# Patient Record
Sex: Female | Born: 1995 | Race: White | Hispanic: No | Marital: Single | Smoking: Never smoker
Health system: Southern US, Community
[De-identification: ages and names within clinical notes are randomized; demographics above are authoritative.]

## PROBLEM LIST (undated history)

## (undated) DIAGNOSIS — Z789 Other specified health status: Secondary | ICD-10-CM

## (undated) HISTORY — DX: Other specified health status: Z78.9

## (undated) HISTORY — PX: NO PAST SURGERIES: SHX2092

---
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
## 2019-08-01 DIAGNOSIS — F329 Major depressive disorder, single episode, unspecified: Secondary | ICD-10-CM | POA: Diagnosis not present

## 2019-08-01 DIAGNOSIS — M6588 Other synovitis and tenosynovitis, other site: Secondary | ICD-10-CM | POA: Diagnosis not present

## 2019-08-01 DIAGNOSIS — M25871 Other specified joint disorders, right ankle and foot: Secondary | ICD-10-CM | POA: Diagnosis not present

## 2019-08-20 DIAGNOSIS — F329 Major depressive disorder, single episode, unspecified: Secondary | ICD-10-CM | POA: Diagnosis not present

## 2019-09-19 DIAGNOSIS — M25871 Other specified joint disorders, right ankle and foot: Secondary | ICD-10-CM | POA: Diagnosis not present

## 2019-09-19 DIAGNOSIS — M25571 Pain in right ankle and joints of right foot: Secondary | ICD-10-CM | POA: Diagnosis not present

## 2019-10-14 DIAGNOSIS — G8918 Other acute postprocedural pain: Secondary | ICD-10-CM | POA: Diagnosis not present

## 2019-10-14 DIAGNOSIS — M25871 Other specified joint disorders, right ankle and foot: Secondary | ICD-10-CM | POA: Diagnosis not present

## 2019-10-14 DIAGNOSIS — M659 Synovitis and tenosynovitis, unspecified: Secondary | ICD-10-CM | POA: Diagnosis not present

## 2019-10-29 DIAGNOSIS — M25871 Other specified joint disorders, right ankle and foot: Secondary | ICD-10-CM | POA: Diagnosis not present

## 2019-11-03 DIAGNOSIS — M25571 Pain in right ankle and joints of right foot: Secondary | ICD-10-CM | POA: Diagnosis not present

## 2019-11-14 DIAGNOSIS — M25571 Pain in right ankle and joints of right foot: Secondary | ICD-10-CM | POA: Diagnosis not present

## 2019-11-17 DIAGNOSIS — M25571 Pain in right ankle and joints of right foot: Secondary | ICD-10-CM | POA: Diagnosis not present

## 2019-11-17 DIAGNOSIS — F329 Major depressive disorder, single episode, unspecified: Secondary | ICD-10-CM | POA: Diagnosis not present

## 2019-11-25 DIAGNOSIS — M25571 Pain in right ankle and joints of right foot: Secondary | ICD-10-CM | POA: Diagnosis not present

## 2019-12-12 DIAGNOSIS — F329 Major depressive disorder, single episode, unspecified: Secondary | ICD-10-CM | POA: Diagnosis not present

## 2019-12-26 DIAGNOSIS — Z0000 Encounter for general adult medical examination without abnormal findings: Secondary | ICD-10-CM | POA: Diagnosis not present

## 2019-12-26 DIAGNOSIS — Z1331 Encounter for screening for depression: Secondary | ICD-10-CM | POA: Diagnosis not present

## 2020-01-20 DIAGNOSIS — F329 Major depressive disorder, single episode, unspecified: Secondary | ICD-10-CM | POA: Diagnosis not present

## 2020-02-18 DIAGNOSIS — S60131A Contusion of right middle finger with damage to nail, initial encounter: Secondary | ICD-10-CM | POA: Diagnosis not present

## 2020-02-20 DIAGNOSIS — F329 Major depressive disorder, single episode, unspecified: Secondary | ICD-10-CM | POA: Diagnosis not present

## 2020-03-08 DIAGNOSIS — Z124 Encounter for screening for malignant neoplasm of cervix: Secondary | ICD-10-CM | POA: Diagnosis not present

## 2020-03-08 DIAGNOSIS — N912 Amenorrhea, unspecified: Secondary | ICD-10-CM | POA: Diagnosis not present

## 2020-03-08 DIAGNOSIS — Z113 Encounter for screening for infections with a predominantly sexual mode of transmission: Secondary | ICD-10-CM | POA: Diagnosis not present

## 2020-03-08 DIAGNOSIS — R87810 Cervical high risk human papillomavirus (HPV) DNA test positive: Secondary | ICD-10-CM | POA: Diagnosis not present

## 2020-03-08 DIAGNOSIS — Z01419 Encounter for gynecological examination (general) (routine) without abnormal findings: Secondary | ICD-10-CM | POA: Diagnosis not present

## 2020-03-08 DIAGNOSIS — Z6826 Body mass index (BMI) 26.0-26.9, adult: Secondary | ICD-10-CM | POA: Diagnosis not present

## 2020-03-08 DIAGNOSIS — Z3202 Encounter for pregnancy test, result negative: Secondary | ICD-10-CM | POA: Diagnosis not present

## 2020-04-08 DIAGNOSIS — N870 Mild cervical dysplasia: Secondary | ICD-10-CM | POA: Diagnosis not present

## 2020-04-08 DIAGNOSIS — Z6826 Body mass index (BMI) 26.0-26.9, adult: Secondary | ICD-10-CM | POA: Diagnosis not present

## 2020-04-08 DIAGNOSIS — B977 Papillomavirus as the cause of diseases classified elsewhere: Secondary | ICD-10-CM | POA: Diagnosis not present

## 2020-04-08 DIAGNOSIS — N72 Inflammatory disease of cervix uteri: Secondary | ICD-10-CM | POA: Diagnosis not present

## 2020-04-08 DIAGNOSIS — R87610 Atypical squamous cells of undetermined significance on cytologic smear of cervix (ASC-US): Secondary | ICD-10-CM | POA: Diagnosis not present

## 2020-04-08 DIAGNOSIS — R87810 Cervical high risk human papillomavirus (HPV) DNA test positive: Secondary | ICD-10-CM | POA: Diagnosis not present

## 2020-04-08 DIAGNOSIS — Z3202 Encounter for pregnancy test, result negative: Secondary | ICD-10-CM | POA: Diagnosis not present

## 2020-04-14 DIAGNOSIS — F329 Major depressive disorder, single episode, unspecified: Secondary | ICD-10-CM | POA: Diagnosis not present

## 2020-06-21 DIAGNOSIS — M79671 Pain in right foot: Secondary | ICD-10-CM | POA: Diagnosis not present

## 2020-06-21 DIAGNOSIS — M25571 Pain in right ankle and joints of right foot: Secondary | ICD-10-CM | POA: Diagnosis not present

## 2020-06-21 DIAGNOSIS — M722 Plantar fascial fibromatosis: Secondary | ICD-10-CM | POA: Diagnosis not present

## 2020-07-06 DIAGNOSIS — F329 Major depressive disorder, single episode, unspecified: Secondary | ICD-10-CM | POA: Diagnosis not present

## 2020-07-20 DIAGNOSIS — Z3201 Encounter for pregnancy test, result positive: Secondary | ICD-10-CM | POA: Diagnosis not present

## 2020-07-20 DIAGNOSIS — Z113 Encounter for screening for infections with a predominantly sexual mode of transmission: Secondary | ICD-10-CM | POA: Diagnosis not present

## 2020-07-20 DIAGNOSIS — N925 Other specified irregular menstruation: Secondary | ICD-10-CM | POA: Diagnosis not present

## 2020-07-20 DIAGNOSIS — Z3480 Encounter for supervision of other normal pregnancy, unspecified trimester: Secondary | ICD-10-CM | POA: Diagnosis not present

## 2020-07-24 NOTE — L&D Delivery Note (Signed)
Patient was found to be 9cm with cervix easily reducable with one push to C/C/+1.  Pt pushed for approx 10 minutes with epidural.   FHT were difficult to assess and appeared to be in the 50s-70s so vaccuum was applied at +3 station 3cm anterior to posterior fontanelle at midline NSVD female infant, Apgars 9/9, weight pending.   The patient had a second degree laceration repaired with 2-0 vicryl. Fundus was firm. EBL was expected amount. Placenta was delivered intact. Vagina was clear.  Delayed cord clamping done for 30-60 seconds while warming baby. Baby was vigorous and doing skin to skin with mother.  Philip Aspen

## 2020-08-20 DIAGNOSIS — O09511 Supervision of elderly primigravida, first trimester: Secondary | ICD-10-CM | POA: Diagnosis not present

## 2020-08-20 DIAGNOSIS — Z3480 Encounter for supervision of other normal pregnancy, unspecified trimester: Secondary | ICD-10-CM | POA: Diagnosis not present

## 2020-08-20 DIAGNOSIS — Z369 Encounter for antenatal screening, unspecified: Secondary | ICD-10-CM | POA: Diagnosis not present

## 2020-08-20 LAB — OB RESULTS CONSOLE ANTIBODY SCREEN: Antibody Screen: NEGATIVE

## 2020-08-20 LAB — OB RESULTS CONSOLE ABO/RH: RH Type: POSITIVE

## 2020-08-20 LAB — OB RESULTS CONSOLE HEPATITIS B SURFACE ANTIGEN: Hepatitis B Surface Ag: NEGATIVE

## 2020-08-20 LAB — OB RESULTS CONSOLE GC/CHLAMYDIA
Chlamydia: NEGATIVE
Gonorrhea: NEGATIVE

## 2020-08-20 LAB — OB RESULTS CONSOLE RPR: RPR: NONREACTIVE

## 2020-08-20 LAB — OB RESULTS CONSOLE RUBELLA ANTIBODY, IGM: Rubella: IMMUNE

## 2020-08-20 LAB — OB RESULTS CONSOLE HIV ANTIBODY (ROUTINE TESTING): HIV: NONREACTIVE

## 2020-09-21 DIAGNOSIS — Z369 Encounter for antenatal screening, unspecified: Secondary | ICD-10-CM | POA: Diagnosis not present

## 2020-09-21 DIAGNOSIS — Z3482 Encounter for supervision of other normal pregnancy, second trimester: Secondary | ICD-10-CM | POA: Diagnosis not present

## 2020-10-18 DIAGNOSIS — Z3A19 19 weeks gestation of pregnancy: Secondary | ICD-10-CM | POA: Diagnosis not present

## 2020-10-18 DIAGNOSIS — Z363 Encounter for antenatal screening for malformations: Secondary | ICD-10-CM | POA: Diagnosis not present

## 2020-11-15 DIAGNOSIS — Z369 Encounter for antenatal screening, unspecified: Secondary | ICD-10-CM | POA: Diagnosis not present

## 2020-12-13 DIAGNOSIS — Z369 Encounter for antenatal screening, unspecified: Secondary | ICD-10-CM | POA: Diagnosis not present

## 2020-12-13 DIAGNOSIS — Z3480 Encounter for supervision of other normal pregnancy, unspecified trimester: Secondary | ICD-10-CM | POA: Diagnosis not present

## 2021-01-05 DIAGNOSIS — Z23 Encounter for immunization: Secondary | ICD-10-CM | POA: Diagnosis not present

## 2021-01-21 DIAGNOSIS — R609 Edema, unspecified: Secondary | ICD-10-CM | POA: Diagnosis not present

## 2021-01-21 DIAGNOSIS — Z369 Encounter for antenatal screening, unspecified: Secondary | ICD-10-CM | POA: Diagnosis not present

## 2021-02-04 DIAGNOSIS — Z369 Encounter for antenatal screening, unspecified: Secondary | ICD-10-CM | POA: Diagnosis not present

## 2021-02-07 DIAGNOSIS — M5489 Other dorsalgia: Secondary | ICD-10-CM | POA: Diagnosis not present

## 2021-02-10 DIAGNOSIS — Z789 Other specified health status: Secondary | ICD-10-CM | POA: Insufficient documentation

## 2021-02-16 ENCOUNTER — Other Ambulatory Visit: Payer: Self-pay

## 2021-02-16 ENCOUNTER — Inpatient Hospital Stay (HOSPITAL_COMMUNITY)
Admission: AD | Admit: 2021-02-16 | Discharge: 2021-02-16 | Disposition: A | Discharge status: Home / Self Care | Payer: BC Managed Care – PPO | Attending: Obstetrics and Gynecology | Admitting: Obstetrics and Gynecology

## 2021-02-16 ENCOUNTER — Encounter (HOSPITAL_COMMUNITY): Payer: Self-pay | Admitting: Obstetrics and Gynecology

## 2021-02-16 DIAGNOSIS — O26893 Other specified pregnancy related conditions, third trimester: Secondary | ICD-10-CM | POA: Insufficient documentation

## 2021-02-16 DIAGNOSIS — Z3A36 36 weeks gestation of pregnancy: Secondary | ICD-10-CM

## 2021-02-16 DIAGNOSIS — O479 False labor, unspecified: Secondary | ICD-10-CM

## 2021-02-16 DIAGNOSIS — N898 Other specified noninflammatory disorders of vagina: Secondary | ICD-10-CM | POA: Insufficient documentation

## 2021-02-16 DIAGNOSIS — Z0371 Encounter for suspected problem with amniotic cavity and membrane ruled out: Secondary | ICD-10-CM

## 2021-02-16 LAB — POCT FERN TEST: POCT Fern Test: NEGATIVE

## 2021-02-16 NOTE — MAU Provider Note (Signed)
Event Date/Time   First Provider Initiated Contact with Patient 02/16/21 0451       S: Ms. Amber Mcfarland is a 25 y.o. G2P1001 at [redacted]w[redacted]d  who presents to MAU today complaining of leaking of fluid since 1 am this morning. She denies vaginal bleeding. She endorses contractions. She reports normal fetal movement.    O: BP 125/75   Pulse 65   Temp 97.9 F (36.6 C) (Oral)   Resp 16   Ht 5\' 3"  (1.6 m)   Wt 77.5 kg   SpO2 99%   BMI 30.26 kg/m  GENERAL: Well-developed, well-nourished female in no acute distress.  HEAD: Normocephalic, atraumatic.  CHEST: Normal effort of breathing, regular heart rate ABDOMEN: Soft, nontender, gravid PELVIC: Normal external female genitalia. Vagina is pink and rugated. Cervix with normal contour, no lesions. Normal discharge.  No pooling.   Cervical exam:  Dilation: 1 Effacement (%): 70 Cervical Position: Posterior Station: -3 Presentation: Vertex Exam by:: K Torphy, RN   Fetal Monitoring: Baseline: 140 Variability: moderate Accelerations: 15x15 Decelerations: none Contractions: irregular  Results for orders placed or performed during the hospital encounter of 02/16/21 (from the past 24 hour(s))  POCT fern test     Status: Normal   Collection Time: 02/16/21  4:59 AM  Result Value Ref Range   POCT Fern Test Negative = intact amniotic membranes      A: SIUP at [redacted]w[redacted]d  Membranes intact  P: Discharge home Labor precautions  [redacted]w[redacted]d, NP 02/16/2021 8:05 AM

## 2021-02-16 NOTE — MAU Note (Signed)
Pt presents to MAU c/o LOF (clear) since 0100 this morning and CTX every 5 min.  Pt endorses +FM, denies vaginal bleeding.  Pt states that she received regular PNC at [facility], last appt was 02/11/2021.  Pt denies any significant medical history, states that this pregnancy has been uncomplicated.

## 2021-02-18 DIAGNOSIS — Z113 Encounter for screening for infections with a predominantly sexual mode of transmission: Secondary | ICD-10-CM | POA: Diagnosis not present

## 2021-02-18 DIAGNOSIS — Z369 Encounter for antenatal screening, unspecified: Secondary | ICD-10-CM | POA: Diagnosis not present

## 2021-02-18 DIAGNOSIS — Z3480 Encounter for supervision of other normal pregnancy, unspecified trimester: Secondary | ICD-10-CM | POA: Diagnosis not present

## 2021-02-21 ENCOUNTER — Ambulatory Visit (INDEPENDENT_AMBULATORY_CARE_PROVIDER_SITE_OTHER): Payer: BC Managed Care – PPO

## 2021-02-21 ENCOUNTER — Encounter: Payer: Self-pay | Admitting: Cardiology

## 2021-02-21 ENCOUNTER — Ambulatory Visit: Payer: BC Managed Care – PPO | Admitting: Cardiology

## 2021-02-21 ENCOUNTER — Other Ambulatory Visit: Payer: Self-pay

## 2021-02-21 VITALS — BP 134/62 | HR 107 | Ht 63.0 in | Wt 172.0 lb

## 2021-02-21 DIAGNOSIS — R002 Palpitations: Secondary | ICD-10-CM | POA: Diagnosis not present

## 2021-02-21 NOTE — Progress Notes (Signed)
Cardio-Obstetrics Clinic  New Evaluation  Date:  02/21/2021   ID:  DAJAI WAHLERT, DOB 04-29-1996, MRN [id number]  PCP:  Melida Quitter, MD   [facility] Providers Cardiologist:  Thomasene Ripple, DO  Electrophysiologist:  None       Referring MD: Charlett Nose, MD   Chief Complaint: Palpitations  History of Present Illness:    Amber Mcfarland is a 25 y.o. female [G2P1001] who is being seen today for the evaluation of palpitations at the request of Charlett Nose, MD. medical history includes mood disorder was previously on Lamictal.  He is here today to be evaluated for palpitations.  Patient tells me over the last several weeks she has had intermittent palpitations.  She notes that he simpatico onset of fast heartbeat which sometimes feels as if her heart skipping beats.  She has not had any chest chest pain but notes that she has had some chest tightness along with this.  She denies any lightheadedness or dizziness.  She has had gradual onset of shortness of breath as her pregnancy progresses but nothing worse.   Prior CV Studies Reviewed: The following studies were reviewed today:  Chest x-ray July 18 2015- IMPRESSION: No acute cardiopulmonary abnormality or acute traumatic injury identified. Mild scoliosis. And we also   Past Medical History:  Diagnosis Date   Medical history non-contributory     Past Surgical History:  Procedure Laterality Date   ANKLE DEBRIDEMENT Right       OB History     Gravida  2   Para  1   Term  1   Preterm      AB      Living  1      SAB      IAB      Ectopic      Multiple  0   Live Births  1               Current Medications: Current Meds  Medication Sig   acetaminophen (TYLENOL) 500 MG tablet Take 1,000 mg by mouth every 6 (six) hours as needed for mild pain or headache.   Prenatal Vit-Fe Fumarate-FA (PRENATAL MULTIVITAMIN) TABS tablet Take 1 tablet by mouth daily at 12 noon.      Allergies:   Patient has no known allergies.   Social History   Socioeconomic History   Marital status: Single    Spouse name: Not on file   Number of children: Not on file   Years of education: Not on file   Highest education level: Not on file  Occupational History   Not on file  Tobacco Use   Smoking status: Never   Smokeless tobacco: Never  Substance and Sexual Activity   Alcohol use: No   Drug use: No   Sexual activity: Yes  Other Topics Concern   Not on file  Social History Narrative   Not on file   Social Determinants of Health   Financial Resource Strain: Low Risk    Difficulty of Paying Living Expenses: Not hard at all  Food Insecurity: Unknown   Worried About Programme researcher, broadcasting/film/video in the Last Year: Not on file   [employer] of Food in the Last Year: Never true  Transportation Needs: No Transportation Needs   Lack of Transportation (Medical): No   Lack of Transportation (Non-Medical): No  Physical Activity: Not on file  Stress: Not on file  Social Connections: Unknown   Frequency of  Communication with Friends and Family: More than three times a week   Frequency of Social Gatherings with Friends and Family: More than three times a week   Attends Religious Services: Not on Scientist, clinical (histocompatibility and immunogenetics)file   Active Member of Clubs or Organizations: Not on file   Attends BankerClub or Organization Meetings: Not on file   Marital Status: Not on file      Family History  Problem Relation Age of Onset   Hypertension Father    Diabetes Father        Borderline   Heart disease Paternal Grandfather    Heart attack Paternal Grandfather [phone]      ROS:   Please see the history of present illness.     Review of Systems  Constitution: Negative for decreased appetite, fever and weight gain.  HENT: Negative for congestion, ear discharge, hoarse voice and sore throat.   Eyes: Negative for discharge, redness, vision loss in right eye and visual halos.  Cardiovascular: Report palpitations.  Negative for  chest pain, dyspnea on exertion, leg swelling, orthopnea.  Respiratory: Negative for cough, hemoptysis, shortness of breath and snoring.   Endocrine: Negative for heat intolerance and polyphagia.  Hematologic/Lymphatic: Negative for bleeding problem. Does not bruise/bleed easily.  Skin: Negative for flushing, nail changes, rash and suspicious lesions.  Musculoskeletal: Negative for arthritis, joint pain, muscle cramps, myalgias, neck pain and stiffness.  Gastrointestinal: Negative for abdominal pain, bowel incontinence, diarrhea and excessive appetite.  Genitourinary: Negative for decreased libido, genital sores and incomplete emptying.  Neurological: Negative for brief paralysis, focal weakness, headaches and loss of balance.  Psychiatric/Behavioral: Negative for altered mental status, depression and suicidal ideas.  Allergic/Immunologic: Negative for HIV exposure and persistent infections.     Labs/EKG Reviewed:    EKG:   EKG was ordered today.  The ekg ordered today demonstrates sinus tachycardia, heart rate 107 bpm  Recent Labs: No results found for requested labs within last 8760 hours.   Recent Lipid Panel No results found for: CHOL, TRIG, HDL, CHOLHDL, LDLCALC, LDLDIRECT  Physical Exam:    VS:  BP 134/62 (BP Location: Right Arm, Patient Position: Sitting, Cuff Size: Normal)   Pulse (!) 107   Ht 5\' 3"  (1.6 m)   Wt 172 lb (78 kg)   SpO2 97%   BMI 30.47 kg/m     Wt Readings from Last 3 Encounters:  02/21/21 172 lb (78 kg)  02/16/21 170 lb 12.8 oz (77.5 kg)  12/23/15 167 lb (75.8 kg) (92 %, Z= 1.41)*   * Growth percentiles are based on CDC (Girls, 2-20 Years) data.     GEN:  Well nourished, well developed in no acute distress HEENT: Normal NECK: No JVD; No carotid bruits LYMPHATICS: No lymphadenopathy CARDIAC: RRR, no murmurs, rubs, gallops RESPIRATORY:  Clear to auscultation without rales, wheezing or rhonchi  ABDOMEN: Soft, non-tender,  non-distended MUSCULOSKELETAL:  No edema; No deformity  SKIN: Warm and dry NEUROLOGIC:  Alert and oriented x 3 PSYCHIATRIC:  Normal affect    Risk Assessment/Risk Calculators:                 ASSESSMENT & PLAN:   Palpitations  I would like to rule out a cardiovascular etiology of this palpitation, therefore at this time I would like to placed a zio patch for   7  days.  She does have a systolic murmur which is suspected to be normal physiology.   Her blood pressure is 134/62 in the office today.  We will  continue to monitor this closely.  She tells me her blood pressure has not been elevated.  once these testing have been performed amd reviewed further reccomendations will be made. For now, I do reccomend that the patient goes to the nearest ED if  symptoms recur.  Patient Instructions  Medication Instructions:  Your physician recommends that you continue on your current medications as directed. Please refer to the Current Medication list given to you today.  *If you need a refill on your cardiac medications before your next appointment, please call your pharmacy*   Lab Work: Your physician recommends that you return for lab work in: Today for Basic Metabolic Panel and Magnesium  If you have labs (blood work) drawn today and your tests are completely normal, you will receive your results only by: MyChart Message (if you have MyChart) OR A paper copy in the mail If you have any lab test that is abnormal or we need to change your treatment, we will call you to review the results.   Testing/Procedures: EKG ZIO XT- Long Term Monitor Instructions  Your physician has requested you wear a ZIO patch monitor for 14 days.  This is a single patch monitor. [employer] supplies one patch monitor per enrollment. Additional stickers are not available. Please do not apply patch if you will be having a Nuclear Stress Test,  Echocardiogram, Cardiac CT, MRI, or Chest Xray during the period  you would be wearing the  monitor. The patch cannot be worn during these tests. You cannot remove and re-apply the  ZIO XT patch monitor.  Your ZIO patch monitor will be mailed 3 day USPS to your address on file. It may take 3-5 days  to receive your monitor after you have been enrolled.  Once you have received your monitor, please review the enclosed instructions. Your monitor  has already been registered assigning a specific monitor serial # to you.  Billing and Patient Assistance Program Information  We have supplied [employer] with any of your insurance information on file for billing purposes. [employer] offers a sliding scale Patient Assistance Program for patients that do not have  insurance, or whose insurance does not completely cover the cost of the ZIO monitor.  You must apply for the Patient Assistance Program to qualify for this discounted rate.  To apply, please call [employer] at [phone], select option 4, select option 2, ask to apply for  Patient Assistance Program. Meredeth Ide will ask your household income, and how many people  are in your household. They will quote your out-of-pocket cost based on that information.  [employer] will also be able to set up a 29-month, interest-free payment plan if needed.  Applying the monitor   Shave hair from upper left chest.  Hold abrader disc by orange tab. Rub abrader in 40 strokes over the upper left chest as  indicated in your monitor instructions.  Clean area with 4 enclosed alcohol pads. Let dry.  Apply patch as indicated in monitor instructions. Patch will be placed under collarbone on left  side of chest with arrow pointing upward.  Rub patch adhesive wings for 2 minutes. Remove white label marked "1". Remove the white  label marked "2". Rub patch adhesive wings for 2 additional minutes.  While looking in a mirror, press and release button in center of patch. A small green light will  flash 3-4 times. This will be your only indicator  that the monitor has been turned on.  Do not shower for the first  24 hours. You may shower after the first 24 hours.  Press the button if you feel a symptom. You will hear a small click. Record Date, Time and  Symptom in the Patient Logbook.  When you are ready to remove the patch, follow instructions on the last 2 pages of Patient  Logbook. Stick patch monitor onto the last page of Patient Logbook.  Place Patient Logbook in the blue and white box. Use locking tab on box and tape box closed  securely. The blue and white box has prepaid postage on it. Please place it in the mailbox as  soon as possible. Your physician should have your test results approximately 7 days after the  monitor has been mailed back to [facility].  Call [facility] Customer Care at [phone] if you have questions regarding  your ZIO XT patch monitor. Call them immediately if you see an orange light blinking on your  monitor.  If your monitor falls off in less than 4 days, contact our Monitor department at [phone].  If your monitor becomes loose or falls off after 4 days call [employer] at [phone] for  suggestions on securing your monitor    Follow-Up: At [facility], you and your health needs are our priority.  As part of our continuing mission to provide you with exceptional heart care, we have created designated Provider Care Teams.  These Care Teams include your primary Cardiologist (physician) and Advanced Practice Providers (APPs -  Physician Assistants and Nurse Practitioners) who all work together to provide you with the care you need, when you need it.  We recommend signing up for the patient portal called "MyChart".  Sign up information is provided on this After Visit Summary.  MyChart is used to connect with patients for Virtual Visits (Telemedicine).  Patients are able to view lab/test results, encounter notes, upcoming appointments, etc.  Non-urgent messages can be sent to your provider  as well.   To learn more about what you can do with MyChart, go to [email or web address].    Your next appointment:   8 week(s)  The format for your next appointment:   In Person  Provider:   Dr. Jolyn Lent Vaani Morren @ [facility]   Other Instructions     Dispo:  No follow-ups on file.   Medication Adjustments/Labs and Tests Ordered: Current medicines are reviewed at length with the patient today.  Concerns regarding medicines are outlined above.  Tests Ordered: Orders Placed This Encounter  Procedures   Magnesium   Basic metabolic panel   LONG TERM MONITOR (3-14 DAYS)   EKG 12-Lead    Medication Changes: No orders of the defined types were placed in this encounter.

## 2021-02-21 NOTE — Patient Instructions (Signed)
Medication Instructions:  Your physician recommends that you continue on your current medications as directed. Please refer to the Current Medication list given to you today.  *If you need a refill on your cardiac medications before your next appointment, please call your pharmacy*   Lab Work: Your physician recommends that you return for lab work in: Today for Basic Metabolic Panel and Magnesium  If you have labs (blood work) drawn today and your tests are completely normal, you will receive your results only by: MyChart Message (if you have MyChart) OR A paper copy in the mail If you have any lab test that is abnormal or we need to change your treatment, we will call you to review the results.   Testing/Procedures: EKG ZIO XT- Long Term Monitor Instructions  Your physician has requested you wear a ZIO patch monitor for 14 days.  This is a single patch monitor. [employer] supplies one patch monitor per enrollment. Additional stickers are not available. Please do not apply patch if you will be having a Nuclear Stress Test,  Echocardiogram, Cardiac CT, MRI, or Chest Xray during the period you would be wearing the  monitor. The patch cannot be worn during these tests. You cannot remove and re-apply the  ZIO XT patch monitor.  Your ZIO patch monitor will be mailed 3 day USPS to your address on file. It may take 3-5 days  to receive your monitor after you have been enrolled.  Once you have received your monitor, please review the enclosed instructions. Your monitor  has already been registered assigning a specific monitor serial # to you.  Billing and Patient Assistance Program Information  We have supplied [employer] with any of your insurance information on file for billing purposes. [employer] offers a sliding scale Patient Assistance Program for patients that do not have  insurance, or whose insurance does not completely cover the cost of the ZIO monitor.  You must apply for the Patient  Assistance Program to qualify for this discounted rate.  To apply, please call [employer] at [phone], select option 4, select option 2, ask to apply for  Patient Assistance Program. Meredeth Ide will ask your household income, and how many people  are in your household. They will quote your out-of-pocket cost based on that information.  [employer] will also be able to set up a 41-month, interest-free payment plan if needed.  Applying the monitor   Shave hair from upper left chest.  Hold abrader disc by orange tab. Rub abrader in 40 strokes over the upper left chest as  indicated in your monitor instructions.  Clean area with 4 enclosed alcohol pads. Let dry.  Apply patch as indicated in monitor instructions. Patch will be placed under collarbone on left  side of chest with arrow pointing upward.  Rub patch adhesive wings for 2 minutes. Remove white label marked "1". Remove the white  label marked "2". Rub patch adhesive wings for 2 additional minutes.  While looking in a mirror, press and release button in center of patch. A small green light will  flash 3-4 times. This will be your only indicator that the monitor has been turned on.  Do not shower for the first 24 hours. You may shower after the first 24 hours.  Press the button if you feel a symptom. You will hear a small click. Record Date, Time and  Symptom in the Patient Logbook.  When you are ready to remove the patch, follow instructions on the last 2 pages of  Patient  Logbook. Stick patch monitor onto the last page of Patient Logbook.  Place Patient Logbook in the blue and white box. Use locking tab on box and tape box closed  securely. The blue and white box has prepaid postage on it. Please place it in the mailbox as  soon as possible. Your physician should have your test results approximately 7 days after the  monitor has been mailed back to [facility].  Call [facility] Customer Care at [phone] if you have questions  regarding  your ZIO XT patch monitor. Call them immediately if you see an orange light blinking on your  monitor.  If your monitor falls off in less than 4 days, contact our Monitor department at [phone].  If your monitor becomes loose or falls off after 4 days call [employer] at [phone] for  suggestions on securing your monitor    Follow-Up: At [facility], you and your health needs are our priority.  As part of our continuing mission to provide you with exceptional heart care, we have created designated Provider Care Teams.  These Care Teams include your primary Cardiologist (physician) and Advanced Practice Providers (APPs -  Physician Assistants and Nurse Practitioners) who all work together to provide you with the care you need, when you need it.  We recommend signing up for the patient portal called "MyChart".  Sign up information is provided on this After Visit Summary.  MyChart is used to connect with patients for Virtual Visits (Telemedicine).  Patients are able to view lab/test results, encounter notes, upcoming appointments, etc.  Non-urgent messages can be sent to your provider as well.   To learn more about what you can do with MyChart, go to [email or web address].    Your next appointment:   8 week(s)  The format for your next appointment:   In Person  Provider:   Dr. Jolyn Lent Tobb @ [facility]   Other Instructions

## 2021-02-22 LAB — BASIC METABOLIC PANEL
BUN/Creatinine Ratio: 16 (ref 9–23)
BUN: 8 mg/dL (ref 6–20)
CO2: 19 mmol/L — ABNORMAL LOW (ref 20–29)
Calcium: 8.6 mg/dL — ABNORMAL LOW (ref 8.7–10.2)
Chloride: 108 mmol/L — ABNORMAL HIGH (ref 96–106)
Creatinine, Ser: 0.51 mg/dL — ABNORMAL LOW (ref 0.57–1.00)
Glucose: 100 mg/dL — ABNORMAL HIGH (ref 65–99)
Potassium: 3.8 mmol/L (ref 3.5–5.2)
Sodium: 140 mmol/L (ref 134–144)
eGFR: 134 mL/min/{1.73_m2} (ref >59)

## 2021-02-22 LAB — MAGNESIUM: Magnesium: 1.7 mg/dL (ref 1.6–2.3)

## 2021-02-25 DIAGNOSIS — Z369 Encounter for antenatal screening, unspecified: Secondary | ICD-10-CM | POA: Diagnosis not present

## 2021-03-02 DIAGNOSIS — R002 Palpitations: Secondary | ICD-10-CM | POA: Diagnosis not present

## 2021-03-04 DIAGNOSIS — Z369 Encounter for antenatal screening, unspecified: Secondary | ICD-10-CM | POA: Diagnosis not present

## 2021-03-08 ENCOUNTER — Encounter (HOSPITAL_COMMUNITY): Payer: Self-pay | Admitting: *Deleted

## 2021-03-08 ENCOUNTER — Telehealth (HOSPITAL_COMMUNITY): Payer: Self-pay | Admitting: *Deleted

## 2021-03-08 NOTE — Telephone Encounter (Signed)
Preadmission screen  

## 2021-03-11 DIAGNOSIS — Z369 Encounter for antenatal screening, unspecified: Secondary | ICD-10-CM | POA: Diagnosis not present

## 2021-03-14 ENCOUNTER — Encounter (HOSPITAL_COMMUNITY): Payer: Self-pay | Admitting: Obstetrics and Gynecology

## 2021-03-14 ENCOUNTER — Other Ambulatory Visit: Payer: Self-pay | Admitting: Obstetrics and Gynecology

## 2021-03-14 ENCOUNTER — Inpatient Hospital Stay (HOSPITAL_COMMUNITY): Payer: BC Managed Care – PPO | Admitting: Anesthesiology

## 2021-03-14 ENCOUNTER — Other Ambulatory Visit: Payer: Self-pay

## 2021-03-14 ENCOUNTER — Inpatient Hospital Stay (HOSPITAL_COMMUNITY)
Admission: AD | Admit: 2021-03-14 | Discharge: 2021-03-15 | DRG: 807 | Disposition: A | Discharge status: Home / Self Care | Payer: BC Managed Care – PPO | Attending: Obstetrics and Gynecology | Admitting: Obstetrics and Gynecology

## 2021-03-14 DIAGNOSIS — Z20822 Contact with and (suspected) exposure to covid-19: Secondary | ICD-10-CM | POA: Diagnosis present

## 2021-03-14 DIAGNOSIS — O99892 Other specified diseases and conditions complicating childbirth: Secondary | ICD-10-CM | POA: Diagnosis not present

## 2021-03-14 DIAGNOSIS — O99824 Streptococcus B carrier state complicating childbirth: Secondary | ICD-10-CM | POA: Diagnosis present

## 2021-03-14 DIAGNOSIS — R000 Tachycardia, unspecified: Secondary | ICD-10-CM | POA: Diagnosis present

## 2021-03-14 DIAGNOSIS — Z3A40 40 weeks gestation of pregnancy: Secondary | ICD-10-CM

## 2021-03-14 DIAGNOSIS — O26893 Other specified pregnancy related conditions, third trimester: Secondary | ICD-10-CM | POA: Diagnosis not present

## 2021-03-14 LAB — RESP PANEL BY RT-PCR (FLU A&B, COVID) ARPGX2
Influenza A by PCR: NEGATIVE
Influenza B by PCR: NEGATIVE
SARS Coronavirus 2 by RT PCR: NEGATIVE

## 2021-03-14 LAB — CBC
HCT: 37.3 % (ref 36.0–46.0)
Hemoglobin: 11.8 g/dL — ABNORMAL LOW (ref 12.0–15.0)
MCH: 26.8 pg (ref 26.0–34.0)
MCHC: 31.6 g/dL (ref 30.0–36.0)
MCV: 84.8 fL (ref 80.0–100.0)
Platelets: 205 10*3/uL (ref 150–400)
RBC: 4.4 MIL/uL (ref 3.87–5.11)
RDW: 13 % (ref 11.5–15.5)
WBC: 7.6 10*3/uL (ref 4.0–10.5)
nRBC: 0 % (ref 0.0–0.2)

## 2021-03-14 LAB — TYPE AND SCREEN
ABO/RH(D): A POS
Antibody Screen: NEGATIVE

## 2021-03-14 LAB — SARS CORONAVIRUS 2 (TAT 6-24 HRS): SARS Coronavirus 2: NEGATIVE

## 2021-03-14 MED ORDER — SODIUM CHLORIDE 0.9 % IV SOLN
2.0000 g | Freq: Once | INTRAVENOUS | Status: AC
  Administered 2021-03-14: 2 g via INTRAVENOUS
  Filled 2021-03-14: qty 2000

## 2021-03-14 MED ORDER — ONDANSETRON HCL 4 MG/2ML IJ SOLN: 4.0000 mg | INTRAMUSCULAR | Status: DC | PRN

## 2021-03-14 MED ORDER — OXYCODONE-ACETAMINOPHEN 5-325 MG PO TABS: 2.0000 | ORAL_TABLET | ORAL | Status: DC | PRN

## 2021-03-14 MED ORDER — EPHEDRINE 5 MG/ML INJ
10.0000 mg | INTRAVENOUS | Status: DC | PRN
Start: 2021-03-14 — End: 2021-03-14

## 2021-03-14 MED ORDER — LACTATED RINGERS IV SOLN: 500.0000 mL | INTRAVENOUS | Status: DC | PRN

## 2021-03-14 MED ORDER — DIPHENHYDRAMINE HCL 25 MG PO CAPS: 25.0000 mg | ORAL_CAPSULE | Freq: Four times a day (QID) | ORAL | Status: DC | PRN

## 2021-03-14 MED ORDER — LACTATED RINGERS IV SOLN: INTRAVENOUS | Status: DC

## 2021-03-14 MED ORDER — MISOPROSTOL 25 MCG QUARTER TABLET: 25.0000 ug | ORAL_TABLET | ORAL | Status: DC | PRN

## 2021-03-14 MED ORDER — LIDOCAINE HCL (PF) 1 % IJ SOLN
30.0000 mL | INTRAMUSCULAR | Status: DC | PRN
  Administered 2021-03-14: 30 mL via SUBCUTANEOUS
  Filled 2021-03-14: qty 30

## 2021-03-14 MED ORDER — OXYTOCIN-SODIUM CHLORIDE 30-0.9 UT/500ML-% IV SOLN
2.5000 [IU]/h | INTRAVENOUS | Status: DC
  Filled 2021-03-14: qty 500

## 2021-03-14 MED ORDER — COCONUT OIL OIL: 1.0000 "application " | TOPICAL_OIL | Status: DC | PRN

## 2021-03-14 MED ORDER — LACTATED RINGERS IV SOLN
500.0000 mL | Freq: Once | INTRAVENOUS | Status: AC
  Administered 2021-03-14: 500 mL via INTRAVENOUS

## 2021-03-14 MED ORDER — OXYTOCIN-SODIUM CHLORIDE 30-0.9 UT/500ML-% IV SOLN: 2.5000 [IU]/h | INTRAVENOUS | Status: DC

## 2021-03-14 MED ORDER — SIMETHICONE 80 MG PO CHEW: 80.0000 mg | CHEWABLE_TABLET | ORAL | Status: DC | PRN

## 2021-03-14 MED ORDER — DIBUCAINE (PERIANAL) 1 % EX OINT: 1.0000 "application " | TOPICAL_OINTMENT | CUTANEOUS | Status: DC | PRN

## 2021-03-14 MED ORDER — TERBUTALINE SULFATE 1 MG/ML IJ SOLN: 0.2500 mg | Freq: Once | INTRAMUSCULAR | Status: DC | PRN

## 2021-03-14 MED ORDER — ONDANSETRON HCL 4 MG/2ML IJ SOLN: 4.0000 mg | Freq: Four times a day (QID) | INTRAMUSCULAR | Status: DC | PRN

## 2021-03-14 MED ORDER — SOD CITRATE-CITRIC ACID 500-334 MG/5ML PO SOLN: 30.0000 mL | ORAL | Status: DC | PRN

## 2021-03-14 MED ORDER — PHENYLEPHRINE 40 MCG/ML (10ML) SYRINGE FOR IV PUSH (FOR BLOOD PRESSURE SUPPORT): 80.0000 ug | PREFILLED_SYRINGE | INTRAVENOUS | Status: DC | PRN

## 2021-03-14 MED ORDER — EPHEDRINE 5 MG/ML INJ: 10.0000 mg | INTRAVENOUS | Status: DC | PRN

## 2021-03-14 MED ORDER — LIDOCAINE HCL (PF) 1 % IJ SOLN
INTRAMUSCULAR | Status: DC | PRN
  Administered 2021-03-14: 11 mL via EPIDURAL

## 2021-03-14 MED ORDER — ONDANSETRON HCL 4 MG PO TABS: 4.0000 mg | ORAL_TABLET | ORAL | Status: DC | PRN

## 2021-03-14 MED ORDER — ACETAMINOPHEN 325 MG PO TABS: 650.0000 mg | ORAL_TABLET | ORAL | Status: DC | PRN

## 2021-03-14 MED ORDER — TETANUS-DIPHTH-ACELL PERTUSSIS 5-2.5-18.5 LF-MCG/0.5 IM SUSY: 0.5000 mL | PREFILLED_SYRINGE | Freq: Once | INTRAMUSCULAR | Status: DC

## 2021-03-14 MED ORDER — IBUPROFEN 600 MG PO TABS
600.0000 mg | ORAL_TABLET | Freq: Four times a day (QID) | ORAL | Status: DC
  Administered 2021-03-14 – 2021-03-15 (×4): 600 mg via ORAL
  Filled 2021-03-14 (×4): qty 1

## 2021-03-14 MED ORDER — OXYCODONE-ACETAMINOPHEN 5-325 MG PO TABS
2.0000 | ORAL_TABLET | ORAL | Status: DC | PRN
Start: 2021-03-14 — End: 2021-03-14

## 2021-03-14 MED ORDER — PRENATAL MULTIVITAMIN CH
1.0000 | ORAL_TABLET | Freq: Every day | ORAL | Status: DC
  Administered 2021-03-15: 1 via ORAL
  Filled 2021-03-14: qty 1

## 2021-03-14 MED ORDER — OXYCODONE-ACETAMINOPHEN 5-325 MG PO TABS: 1.0000 | ORAL_TABLET | ORAL | Status: DC | PRN

## 2021-03-14 MED ORDER — ACETAMINOPHEN 325 MG PO TABS
650.0000 mg | ORAL_TABLET | ORAL | Status: DC | PRN
  Administered 2021-03-14: 650 mg via ORAL
  Filled 2021-03-14: qty 2

## 2021-03-14 MED ORDER — SODIUM CHLORIDE 0.9 % IV SOLN: 5.0000 10*6.[IU] | Freq: Once | INTRAVENOUS | Status: DC

## 2021-03-14 MED ORDER — FENTANYL-BUPIVACAINE-NACL 0.5-0.125-0.9 MG/250ML-% EP SOLN
12.0000 mL/h | EPIDURAL | Status: DC | PRN
  Administered 2021-03-14: 12 mL/h via EPIDURAL
  Filled 2021-03-14: qty 250

## 2021-03-14 MED ORDER — WITCH HAZEL-GLYCERIN EX PADS: 1.0000 "application " | MEDICATED_PAD | CUTANEOUS | Status: DC | PRN

## 2021-03-14 MED ORDER — OXYCODONE HCL 5 MG PO TABS: 10.0000 mg | ORAL_TABLET | ORAL | Status: DC | PRN

## 2021-03-14 MED ORDER — OXYTOCIN BOLUS FROM INFUSION: 333.0000 mL | Freq: Once | INTRAVENOUS | Status: DC

## 2021-03-14 MED ORDER — LIDOCAINE HCL (PF) 1 % IJ SOLN: 30.0000 mL | INTRAMUSCULAR | Status: DC | PRN

## 2021-03-14 MED ORDER — FLEET ENEMA 7-19 GM/118ML RE ENEM: 1.0000 | ENEMA | Freq: Every day | RECTAL | Status: DC | PRN

## 2021-03-14 MED ORDER — DIPHENHYDRAMINE HCL 50 MG/ML IJ SOLN: 12.5000 mg | INTRAMUSCULAR | Status: DC | PRN

## 2021-03-14 MED ORDER — ZOLPIDEM TARTRATE 5 MG PO TABS: 5.0000 mg | ORAL_TABLET | Freq: Every evening | ORAL | Status: DC | PRN

## 2021-03-14 MED ORDER — BENZOCAINE-MENTHOL 20-0.5 % EX AERO
1.0000 "application " | INHALATION_SPRAY | CUTANEOUS | Status: DC | PRN
  Administered 2021-03-14: 1 via TOPICAL
  Filled 2021-03-14: qty 56

## 2021-03-14 MED ORDER — OXYTOCIN BOLUS FROM INFUSION
333.0000 mL | Freq: Once | INTRAVENOUS | Status: AC
  Administered 2021-03-14: 333 mL via INTRAVENOUS

## 2021-03-14 MED ORDER — OXYCODONE HCL 5 MG PO TABS: 5.0000 mg | ORAL_TABLET | ORAL | Status: DC | PRN

## 2021-03-14 MED ORDER — SENNOSIDES-DOCUSATE SODIUM 8.6-50 MG PO TABS
2.0000 | ORAL_TABLET | Freq: Every day | ORAL | Status: DC
  Administered 2021-03-15: 2 via ORAL
  Filled 2021-03-14: qty 2

## 2021-03-14 MED ORDER — FENTANYL CITRATE (PF) 100 MCG/2ML IJ SOLN
100.0000 ug | Freq: Once | INTRAMUSCULAR | Status: AC
  Administered 2021-03-14: 100 ug via INTRAVENOUS

## 2021-03-14 MED ORDER — PENICILLIN G POT IN DEXTROSE 60000 UNIT/ML IV SOLN: 3.0000 10*6.[IU] | INTRAVENOUS | Status: DC

## 2021-03-14 MED ORDER — FENTANYL CITRATE (PF) 100 MCG/2ML IJ SOLN
INTRAMUSCULAR | Status: AC
  Filled 2021-03-14: qty 2

## 2021-03-14 NOTE — MAU Note (Signed)
Patient transport to L&D accompanied by Dr. Adrian Blackwater.

## 2021-03-14 NOTE — Anesthesia Preprocedure Evaluation (Signed)
Anesthesia Evaluation  Patient identified by MRN, date of birth, ID band Patient awake    Reviewed: Allergy & Precautions, Patient's Chart, lab work & pertinent test results  Airway Mallampati: II  TM Distance: >3 FB Neck ROM: Full    Dental no notable dental hx.    Pulmonary neg pulmonary ROS,    Pulmonary exam normal breath sounds clear to auscultation       Cardiovascular negative cardio ROS Normal cardiovascular exam Rhythm:Regular Rate:Normal     Neuro/Psych negative neurological ROS     GI/Hepatic negative GI ROS, Neg liver ROS,   Endo/Other  negative endocrine ROS  Renal/GU negative Renal ROS     Musculoskeletal   Abdominal   Peds  Hematology negative hematology ROS (+)   Anesthesia Other Findings   Reproductive/Obstetrics (+) Pregnancy                             Lab Results  Component Value Date   WBC 7.6 03/14/2021   HGB 11.8 (L) 03/14/2021   HCT 37.3 03/14/2021   MCV 84.8 03/14/2021   PLT 205 03/14/2021    Anesthesia Physical  Anesthesia Plan  ASA: II  Anesthesia Plan: Epidural   Post-op Pain Management:    Induction:   PONV Risk Score and Plan:   Airway Management Planned: Natural Airway  Additional Equipment:   Intra-op Plan:   Post-operative Plan:   Informed Consent: I have reviewed the patients History and Physical, chart, labs and discussed the procedure including the risks, benefits and alternatives for the proposed anesthesia with the patient or authorized representative who has indicated his/her understanding and acceptance.       Plan Discussed with:   Anesthesia Plan Comments:         Anesthesia Quick Evaluation

## 2021-03-14 NOTE — MAU Note (Signed)
Contractions started 1-2 hrs ago, now every 3-4 min.  No bleeding or leaking.  Was 3 cm last wk. No problems with preg.

## 2021-03-14 NOTE — H&P (Signed)
25 y.o. [redacted]w[redacted]d  G2P1001 comes in c/o contractions.  Otherwise has good fetal movement and no bleeding.  Past Medical History:  Diagnosis Date   Medical history non-contributory     Past Surgical History:  Procedure Laterality Date   ANKLE DEBRIDEMENT Right     OB History  Gravida Para Term Preterm AB Living  2 1 1     1   SAB IAB Ectopic Multiple Live Births        0 1    # Outcome Date GA Lbr Len/2nd Weight Sex Delivery Anes PTL Lv  2 Current           1 Term 12/24/15 [redacted]w[redacted]d 04:54 / 00:48 3205 g F Vag-Spont EPI  LIV     Birth Comments: Normal NBS --HB FA Newborn Screen Barcode: [redacted]w[redacted]d Date Collected: 12/25/2015    Social History   Socioeconomic History   Marital status: Single    Spouse name: Not on file   Number of children: Not on file   Years of education: Not on file   Highest education level: Not on file  Occupational History   Not on file  Tobacco Use   Smoking status: Never   Smokeless tobacco: Never  Substance and Sexual Activity   Alcohol use: No   Drug use: No   Sexual activity: Yes  Other Topics Concern   Not on file  Social History Narrative   Not on file   Social Determinants of Health   Financial Resource Strain: Low Risk    Difficulty of Paying Living Expenses: Not hard at all  Food Insecurity: Unknown   Worried About 02/24/2016 in the Last Year: Not on file   Programme researcher, broadcasting/film/video of Food in the Last Year: Never true  Transportation Needs: No Transportation Needs   Lack of Transportation (Medical): No   Lack of Transportation (Non-Medical): No  Physical Activity: Not on file  Stress: Not on file  Social Connections: Unknown   Frequency of Communication with Friends and Family: More than three times a week   Frequency of Social Gatherings with Friends and Family: More than three times a week   Attends Religious Services: Not on file   Active Member of Clubs or Organizations: Not on file   Attends [employer] Meetings: Not on file    Marital Status: Not on file  Intimate Partner Violence: Not on file   Patient has no known allergies.    Prenatal Transfer Tool  Maternal Diabetes: No Genetic Screening: Normal Maternal Ultrasounds/Referrals: Normal Fetal Ultrasounds or other Referrals:  None Maternal Substance Abuse:  No Significant Maternal Medications:  None Significant Maternal Lab Results: Group B Strep positive  Other PNC: pt reported palpitations, s/p cardiology consult 8/1, EKG sinus tachycardia, zio patch ordered, f/u 8 weeks planned    Vitals:   03/14/21 1403 03/14/21 1405 03/14/21 1406 03/14/21 1407  BP: 122/78  115/89   Pulse: 66  81   Resp:    19  Temp:    98.2 F (36.8 C)  TempSrc:    Oral  SpO2: 94% 95% 95%   Weight:      Height:        Lungs/Cor:  NAD Abdomen:  soft, gravid Ex:  no cords, erythema SVE:  8.5/90/-1 FHTs:  135 good STV, NST R; Cat 1 tracing. Toco:  q 1-3   A/P   Admitted at 40.1 with labor  GBS Pos- s/p ampicillin administration  Now with epidural  Other routine care   Philip Aspen

## 2021-03-14 NOTE — Anesthesia Procedure Notes (Signed)
Epidural Patient location during procedure: OB Start time: 03/14/2021 1:40 PM End time: 03/14/2021 1:52 PM  Staffing Anesthesiologist: Lowella Curb, MD Performed: anesthesiologist   Preanesthetic Checklist Completed: patient identified, IV checked, site marked, risks and benefits discussed, surgical consent, monitors and equipment checked, pre-op evaluation and timeout performed  Epidural Patient position: sitting Prep: ChloraPrep Patient monitoring: heart rate, cardiac monitor, continuous pulse ox and blood pressure Approach: midline Location: L2-L3 Injection technique: LOR saline  Needle:  Needle type: Tuohy  Needle gauge: 17 G Needle length: 9 cm Needle insertion depth: 5 cm Catheter type: closed end flexible Catheter size: 20 Guage Catheter at skin depth: 9 cm Test dose: negative  Assessment Events: blood not aspirated, injection not painful, no injection resistance, no paresthesia and negative IV test  Additional Notes Reason for block:procedure for pain

## 2021-03-15 ENCOUNTER — Inpatient Hospital Stay (HOSPITAL_COMMUNITY): Payer: BC Managed Care – PPO

## 2021-03-15 ENCOUNTER — Inpatient Hospital Stay (HOSPITAL_COMMUNITY)
Admission: AD | Admit: 2021-03-15 | Payer: BC Managed Care – PPO | Source: Home / Self Care | Admitting: Obstetrics and Gynecology

## 2021-03-15 LAB — CBC
HCT: 30.7 % — ABNORMAL LOW (ref 36.0–46.0)
Hemoglobin: 10.1 g/dL — ABNORMAL LOW (ref 12.0–15.0)
MCH: 27.4 pg (ref 26.0–34.0)
MCHC: 32.9 g/dL (ref 30.0–36.0)
MCV: 83.4 fL (ref 80.0–100.0)
Platelets: 164 10*3/uL (ref 150–400)
RBC: 3.68 MIL/uL — ABNORMAL LOW (ref 3.87–5.11)
RDW: 13 % (ref 11.5–15.5)
WBC: 10.1 10*3/uL (ref 4.0–10.5)
nRBC: 0.2 % (ref 0.0–0.2)

## 2021-03-15 LAB — RPR: RPR Ser Ql: NONREACTIVE

## 2021-03-15 NOTE — Anesthesia Postprocedure Evaluation (Signed)
Anesthesia Post Note  Patient: Amber Mcfarland  Procedure(s) Performed: AN AD HOC LABOR EPIDURAL     Patient location during evaluation: Mother Baby Anesthesia Type: Epidural Level of consciousness: awake and alert Pain management: pain level controlled Vital Signs Assessment: post-procedure vital signs reviewed and stable Respiratory status: spontaneous breathing, nonlabored ventilation and respiratory function stable Cardiovascular status: stable Postop Assessment: no headache, no backache and epidural receding Anesthetic complications: no   No notable events documented.  Last Vitals:  Vitals:   03/15/21 0059 03/15/21 0519  BP: 129/68 126/89  Pulse: (!) 58 65  Resp: 18 18  Temp:  36.6 C  SpO2: 99% 100%    Last Pain:  Vitals:   03/15/21 0519  TempSrc: Oral  PainSc: 0-No pain   Pain Goal:                   [name]

## 2021-03-15 NOTE — Progress Notes (Signed)
Patient is eating, ambulating, voiding.  Pain control is good.  Vitals:   03/14/21 1755 03/14/21 2100 03/15/21 0059 03/15/21 0519  BP: 124/71 128/79 129/68 126/89  Pulse: 60 75 (!) 58 65  Resp: 18 18 18 18   Temp: 98.4 F (36.9 C) 97.8 F (36.6 C)  97.8 F (36.6 C)  TempSrc: Oral Oral Oral Oral  SpO2:  98% 99% 100%  Weight:      Height:        Fundus firm Perineum without swelling.  Lab Results  Component Value Date   WBC 10.1 03/15/2021   HGB 10.1 (L) 03/15/2021   HCT 30.7 (L) 03/15/2021   MCV 83.4 03/15/2021   PLT 164 03/15/2021    --/--/A POS (08/22 1300)/RI  A/P Post partum day 1.  Routine care.  Expect d/c routine.   Parents desires circumsision.  All risks, benefits and alternatives discussed with the mother.   07-03-1970

## 2021-03-15 NOTE — Discharge Summary (Signed)
Postpartum Discharge Summary  Date of Service updated      Patient Name: Amber Mcfarland DOB: Sep 29, 1995 MRN: [id number]  Date of admission: 03/14/2021 Delivery date:03/14/2021  Delivering provider: Allyn Kenner  Date of discharge: 03/15/2021  Admitting diagnosis: Pregnancy [Z34.90] Intrauterine pregnancy: [redacted]w[redacted]d    Secondary diagnosis:  Active Problems:   * No active hospital problems. *  Additional problems: none    Discharge diagnosis: Term Pregnancy Delivered                                              Post partum procedures: none Augmentation: N/A Complications: None  Hospital course: Onset of Labor With Vaginal Delivery      25y.o. yo [id number] admitted in Active Labor on 03/14/2021. Patient had an uncomplicated labor course as follows:  Membrane Rupture Time/Date: 2:19 PM ,03/14/2021   Delivery Method:Vaginal, Vacuum (Extractor)  Episiotomy: None  Lacerations:  2nd degree;Perineal  Patient had an uncomplicated postpartum course.  She is ambulating, tolerating a regular diet, passing flatus, and urinating well. Patient is discharged home in stable condition on 03/15/21.  Newborn Data: Birth date:03/14/2021  Birth time:2:32 PM  Gender:Female  Living status:Living  Apgars:9 ,9  Weight:3660 g   Magnesium Sulfate received: No BMZ received: No Rhophylac:No MMR:No T-DaP:Given prenatally Flu: No Transfusion:No  Physical exam  Vitals:   03/14/21 1755 03/14/21 2100 03/15/21 0059 03/15/21 0519  BP: 124/71 128/79 129/68 126/89  Pulse: 60 75 (!) 58 65  Resp: _0 Temp: 98.4 F (36.9 C) 97.8 F (36.6 C)  97.8 F (36.6 C)  TempSrc: Oral Oral Oral Oral  SpO2:  98% 99% 100%  Weight:      Height:       Labs: Lab Results  Component Value Date   WBC 10.1 03/15/2021   HGB 10.1 (L) 03/15/2021   HCT 30.7 (L) 03/15/2021   MCV 83.4 03/15/2021   PLT 164 03/15/2021   CMP Latest Ref Rng & Units 02/21/2021  Glucose 65 - 99 mg/dL 100(H)  BUN 6 -  20 mg/dL 8  Creatinine 0.57 - 1.00 mg/dL 0.51(L)  Sodium 134 - 144 mmol/L 140  Potassium 3.5 - 5.2 mmol/L 3.8  Chloride 96 - 106 mmol/L 108(H)  CO2 20 - 29 mmol/L 19(L)  Calcium 8.7 - 10.2 mg/dL 8.6(L)   EdFlavia Shippercore: Edinburgh Postnatal Depression Scale Screening Tool 03/14/2021  I have been able to laugh and see the funny side of things. 0  I have looked forward with enjoyment to things. 0  I have blamed myself unnecessarily when things went wrong. 0  I have been anxious or worried for no good reason. 1  I have felt scared or panicky for no good reason. 1  Things have been getting on top of me. 1  I have been so unhappy that I have had difficulty sleeping. 0  I have felt sad or miserable. 0  I have been so unhappy that I have been crying. 0  The thought of harming myself has occurred to me. 0  Edinburgh Postnatal Depression Scale Total 3      After visit meds:  Allergies as of 03/15/2021   No Known Allergies      Medication List     TAKE these medications    acetaminophen 500 MG tablet Commonly  known as: TYLENOL Take 1,000 mg by mouth every 6 (six) hours as needed for mild pain or headache.               Discharge Care Instructions  (From admission, onward)           Start     Ordered   03/15/21 0000  Discharge wound care:       Comments: Sitz baths and icepacks to perineum.  If stitches, they will dissolve.   03/15/21 0843             Discharge home in stable condition Infant Feeding: Bottle Infant Disposition:home with mother Discharge instruction: per After Visit Summary and Postpartum booklet. Activity: Advance as tolerated. Pelvic rest for 6 weeks.  Diet: routine diet Anticipated Birth Control: Unsure Postpartum Appointment:4 weeks Additional Postpartum F/U:  none Future Appointments: Future Appointments  Date Time Provider Department Center  04/25/2021  9:00 AM Tobb, Kardie, DO [facility]   Follow up Visit:  Follow-up  Information     Callahan, Sidney, DO Follow up in 4 week(s).   Specialty: Obstetrics and Gynecology Contact information: [address] [place] [address] [phone]                     03/15/2021 Michelle A Horvath, MD   

## 2021-03-24 ENCOUNTER — Telehealth (HOSPITAL_COMMUNITY): Payer: Self-pay | Admitting: *Deleted

## 2021-03-24 NOTE — Telephone Encounter (Signed)
Attempted hospital discharge follow-up call. Left message for patient to return RN call. Deforest Hoyles, RN 03/24/21, [phone].

## 2021-04-13 DIAGNOSIS — R87610 Atypical squamous cells of undetermined significance on cytologic smear of cervix (ASC-US): Secondary | ICD-10-CM | POA: Diagnosis not present

## 2021-04-25 ENCOUNTER — Ambulatory Visit: Payer: BC Managed Care – PPO | Admitting: Cardiology

## 2021-09-07 DIAGNOSIS — Z6822 Body mass index (BMI) 22.0-22.9, adult: Secondary | ICD-10-CM | POA: Diagnosis not present

## 2021-09-07 DIAGNOSIS — R002 Palpitations: Secondary | ICD-10-CM | POA: Diagnosis not present

## 2021-09-07 DIAGNOSIS — Z13220 Encounter for screening for lipoid disorders: Secondary | ICD-10-CM | POA: Diagnosis not present

## 2021-09-07 DIAGNOSIS — Z0000 Encounter for general adult medical examination without abnormal findings: Secondary | ICD-10-CM | POA: Diagnosis not present

## 2021-09-07 DIAGNOSIS — F39 Unspecified mood [affective] disorder: Secondary | ICD-10-CM | POA: Diagnosis not present

## 2021-12-09 DIAGNOSIS — F411 Generalized anxiety disorder: Secondary | ICD-10-CM | POA: Diagnosis not present

## 2021-12-09 DIAGNOSIS — F900 Attention-deficit hyperactivity disorder, predominantly inattentive type: Secondary | ICD-10-CM | POA: Diagnosis not present

## 2022-01-10 DIAGNOSIS — F900 Attention-deficit hyperactivity disorder, predominantly inattentive type: Secondary | ICD-10-CM | POA: Diagnosis not present

## 2022-01-10 DIAGNOSIS — F411 Generalized anxiety disorder: Secondary | ICD-10-CM | POA: Diagnosis not present

## 2022-01-10 DIAGNOSIS — F329 Major depressive disorder, single episode, unspecified: Secondary | ICD-10-CM | POA: Diagnosis not present

## 2022-02-17 DIAGNOSIS — J029 Acute pharyngitis, unspecified: Secondary | ICD-10-CM | POA: Diagnosis not present

## 2022-05-02 DIAGNOSIS — F900 Attention-deficit hyperactivity disorder, predominantly inattentive type: Secondary | ICD-10-CM | POA: Diagnosis not present

## 2022-05-02 DIAGNOSIS — F411 Generalized anxiety disorder: Secondary | ICD-10-CM | POA: Diagnosis not present
